# Patient Record
Sex: Male | Born: 1975 | Hispanic: Refuse to answer | Marital: Married | Smoking: Never smoker
Health system: Southern US, Community
[De-identification: ages and names within clinical notes are randomized; demographics above are authoritative.]

## PROBLEM LIST (undated history)

## (undated) DIAGNOSIS — M25511 Pain in right shoulder: Secondary | ICD-10-CM

## (undated) DIAGNOSIS — Z8619 Personal history of other infectious and parasitic diseases: Secondary | ICD-10-CM

## (undated) DIAGNOSIS — M25561 Pain in right knee: Secondary | ICD-10-CM

## (undated) DIAGNOSIS — Z808 Family history of malignant neoplasm of other organs or systems: Secondary | ICD-10-CM

## (undated) HISTORY — DX: Personal history of other infectious and parasitic diseases: Z86.19

## (undated) HISTORY — DX: Family history of malignant neoplasm of other organs or systems: Z80.8

## (undated) HISTORY — DX: Pain in right knee: M25.561

## (undated) HISTORY — DX: Pain in right shoulder: M25.511

## (undated) HISTORY — PX: KNEE SURGERY: SHX244

---

## 2019-05-31 ENCOUNTER — Ambulatory Visit: Payer: Self-pay | Admitting: Medical

## 2019-06-02 ENCOUNTER — Encounter: Payer: Self-pay | Admitting: Medical

## 2019-06-02 ENCOUNTER — Telehealth: Payer: Self-pay | Admitting: Medical

## 2019-06-02 ENCOUNTER — Ambulatory Visit: Payer: BC Managed Care – PPO | Admitting: Medical

## 2019-06-02 ENCOUNTER — Other Ambulatory Visit: Payer: Self-pay

## 2019-06-02 VITALS — BP 110/78 | HR 57 | Temp 97.8°F | Ht 73.0 in | Wt 214.0 lb

## 2019-06-02 DIAGNOSIS — Z7185 Encounter for immunization safety counseling: Secondary | ICD-10-CM | POA: Insufficient documentation

## 2019-06-02 DIAGNOSIS — R29898 Other symptoms and signs involving the musculoskeletal system: Secondary | ICD-10-CM | POA: Diagnosis not present

## 2019-06-02 DIAGNOSIS — Z9889 Other specified postprocedural states: Secondary | ICD-10-CM

## 2019-06-02 DIAGNOSIS — B001 Herpesviral vesicular dermatitis: Secondary | ICD-10-CM | POA: Insufficient documentation

## 2019-06-02 DIAGNOSIS — M25511 Pain in right shoulder: Secondary | ICD-10-CM

## 2019-06-02 DIAGNOSIS — Z8619 Personal history of other infectious and parasitic diseases: Secondary | ICD-10-CM

## 2019-06-02 DIAGNOSIS — Z808 Family history of malignant neoplasm of other organs or systems: Secondary | ICD-10-CM

## 2019-06-02 DIAGNOSIS — S60222A Contusion of left hand, initial encounter: Secondary | ICD-10-CM | POA: Diagnosis not present

## 2019-06-02 DIAGNOSIS — M542 Cervicalgia: Secondary | ICD-10-CM | POA: Diagnosis not present

## 2019-06-02 DIAGNOSIS — G8929 Other chronic pain: Secondary | ICD-10-CM

## 2019-06-02 DIAGNOSIS — Z7189 Other specified counseling: Secondary | ICD-10-CM

## 2019-06-02 DIAGNOSIS — S7002XA Contusion of left hip, initial encounter: Secondary | ICD-10-CM | POA: Insufficient documentation

## 2019-06-02 NOTE — Telephone Encounter (Signed)
Called [facility] and they states that for you to fax a RX for PT and fax it to them at [phone].   If you will sign the RX I will fax.
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
## 2019-06-02 NOTE — Telephone Encounter (Signed)
Refer to physical therapy.   He sees [facility] already.  Try referring to their in house physical therapist at [facility] for chronic neck and right shoulder issues.   If for whatever reason they wont let us refer, then refer to [facility] Physical therapy or [facility] Physical Therapy/sports medicine.

## 2019-06-03 NOTE — Telephone Encounter (Signed)
rx ready 

## 2019-06-03 NOTE — Telephone Encounter (Signed)
Faxed. done

## 2019-06-22 ENCOUNTER — Telehealth: Payer: Self-pay | Admitting: Family Medicine

## 2019-06-22 NOTE — Telephone Encounter (Signed)
Requested records from Dermatology Specialist received.

## 2019-06-30 ENCOUNTER — Encounter: Payer: Self-pay | Admitting: Medical

## 2019-07-11 ENCOUNTER — Encounter: Payer: Self-pay | Admitting: Medical

## 2019-08-19 ENCOUNTER — Other Ambulatory Visit: Payer: Self-pay

## 2019-08-19 ENCOUNTER — Encounter: Payer: Self-pay | Admitting: Medical

## 2019-08-19 ENCOUNTER — Ambulatory Visit: Payer: BC Managed Care – PPO | Admitting: Medical

## 2019-08-19 ENCOUNTER — Ambulatory Visit
Admission: RE | Admit: 2019-08-19 | Discharge: 2019-08-19 | Disposition: A | Discharge status: Home / Self Care | Payer: BC Managed Care – PPO | Source: Ambulatory Visit | Attending: Medical | Admitting: Medical

## 2019-08-19 VITALS — BP 118/79 | Temp 98.6°F | Ht 73.0 in | Wt 210.0 lb

## 2019-08-19 DIAGNOSIS — R0781 Pleurodynia: Secondary | ICD-10-CM

## 2019-08-19 DIAGNOSIS — S0081XA Abrasion of other part of head, initial encounter: Secondary | ICD-10-CM

## 2019-08-19 DIAGNOSIS — B001 Herpesviral vesicular dermatitis: Secondary | ICD-10-CM | POA: Diagnosis not present

## 2019-08-19 DIAGNOSIS — S40819A Abrasion of unspecified upper arm, initial encounter: Secondary | ICD-10-CM | POA: Diagnosis not present

## 2019-08-19 DIAGNOSIS — W19XXXA Unspecified fall, initial encounter: Secondary | ICD-10-CM

## 2019-08-19 DIAGNOSIS — R0789 Other chest pain: Secondary | ICD-10-CM

## 2019-08-19 MED ORDER — MUPIROCIN 2 % EX OINT: 1.0000 "application " | TOPICAL_OINTMENT | Freq: Three times a day (TID) | CUTANEOUS | 0 refills | Status: DC

## 2019-08-19 MED ORDER — VALACYCLOVIR HCL 1 G PO TABS: 1000.0000 mg | ORAL_TABLET | Freq: Every day | ORAL | 1 refills | Status: DC
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
## 2019-09-20 ENCOUNTER — Telehealth: Payer: Self-pay

## 2019-09-20 NOTE — Telephone Encounter (Signed)
Per pharmacy he filled 90 day supply on 10/21 and ins will not cover again until end of December   Pt states he discussed with you that he needed to take more frequently and wants to know if there is a way to get sooner

## 2019-09-20 NOTE — Telephone Encounter (Signed)
Pt. Called stating that he saw you about 1 month ago he said you guys discussed calling in Valtrex for cold sores he said they were never called in at the pharmacy.

## 2019-09-20 NOTE — Telephone Encounter (Signed)
Valtrex was indeed called in for 90# and refill at [facility] [address].   Please call and verify at pharmacy.  Done on 08/19/2019 I believe.

## 2019-09-21 NOTE — Telephone Encounter (Signed)
When we last talked, if I recall, we discussed that he uses this 1/2 tablet daily for prophylaxis, 2 tablets twice daily for 2 days for flare up, with an average of 1 flareup per month.   If this is the case, he would use on average 69 tablets per 3 months.  So #90 should be adequate.   If he is using 1 tablet daily for prophylaxis instead of 1/2 tablet daily, then it would not be adequate.     Thus, please verify because I thought he told me his prior PCP prescribed #90 at a time for 90 day supply.    Let me know and we can figure out what to do at the pharmacy.

## 2019-09-22 NOTE — Telephone Encounter (Signed)
Pt. Called back to let us know he may need a little more than #90 on the valtrex because last time he got #90 and it ran out before the 90 days was up, but he said if that's all you can send in that would be ok if we could maybe change it for next time.

## 2019-09-23 ENCOUNTER — Other Ambulatory Visit: Payer: Self-pay | Admitting: Medical

## 2019-09-23 MED ORDER — VALACYCLOVIR HCL 1 G PO TABS: 1000.0000 mg | ORAL_TABLET | Freq: Every day | ORAL | 1 refills | Status: DC

## 2019-09-23 NOTE — Telephone Encounter (Signed)
Okay, in that case call the pharmacy back and see if we change the quantity to #120 for a 90-day supply, can he go ahead and pick this up now since he is having to use some more frequently for flareup?  If they need me to just  send in a different prescription that is fine as well

## 2019-09-23 NOTE — Telephone Encounter (Signed)
Pharamcy already has a quantity of 120 waiting at the pharmacy for pick up.

## 2019-10-07 ENCOUNTER — Other Ambulatory Visit: Payer: Self-pay | Admitting: Medical

## 2019-11-11 ENCOUNTER — Other Ambulatory Visit: Payer: Self-pay | Admitting: Medical

## 2019-12-17 ENCOUNTER — Other Ambulatory Visit: Payer: Self-pay | Admitting: Medical

## 2019-12-20 NOTE — Telephone Encounter (Signed)
Please call pharmacy and find out why I keep getting this request for Valtrex even notes not due for renewal.  Every time they send these things when is not time, it confuses things, makes me and you do extra work trying to figure out when the last refill was, versus when due.

## 2020-05-22 ENCOUNTER — Other Ambulatory Visit: Payer: Self-pay

## 2020-05-22 ENCOUNTER — Other Ambulatory Visit: Payer: BC Managed Care – PPO

## 2020-05-22 DIAGNOSIS — Z20822 Contact with and (suspected) exposure to covid-19: Secondary | ICD-10-CM

## 2020-05-23 LAB — SARS-COV-2, NAA 2 DAY TAT

## 2020-05-23 LAB — NOVEL CORONAVIRUS, NAA: SARS-CoV-2, NAA: NOT DETECTED

## 2020-05-31 ENCOUNTER — Telehealth: Payer: Self-pay | Admitting: Medical

## 2020-05-31 ENCOUNTER — Other Ambulatory Visit: Payer: Self-pay

## 2020-05-31 ENCOUNTER — Telehealth: Payer: BC Managed Care – PPO | Admitting: Medical

## 2020-05-31 ENCOUNTER — Other Ambulatory Visit: Payer: Self-pay | Admitting: Medical

## 2020-05-31 VITALS — Ht 73.0 in | Wt 213.0 lb

## 2020-05-31 DIAGNOSIS — R0683 Snoring: Secondary | ICD-10-CM | POA: Insufficient documentation

## 2020-05-31 DIAGNOSIS — Z1329 Encounter for screening for other suspected endocrine disorder: Secondary | ICD-10-CM

## 2020-05-31 DIAGNOSIS — L237 Allergic contact dermatitis due to plants, except food: Secondary | ICD-10-CM | POA: Insufficient documentation

## 2020-05-31 DIAGNOSIS — B001 Herpesviral vesicular dermatitis: Secondary | ICD-10-CM | POA: Diagnosis not present

## 2020-05-31 DIAGNOSIS — R400 Somnolence: Secondary | ICD-10-CM

## 2020-05-31 DIAGNOSIS — F458 Other somatoform disorders: Secondary | ICD-10-CM | POA: Insufficient documentation

## 2020-05-31 DIAGNOSIS — Z0000 Encounter for general adult medical examination without abnormal findings: Secondary | ICD-10-CM | POA: Insufficient documentation

## 2020-05-31 DIAGNOSIS — N469 Male infertility, unspecified: Secondary | ICD-10-CM

## 2020-05-31 DIAGNOSIS — Z13220 Encounter for screening for lipoid disorders: Secondary | ICD-10-CM

## 2020-05-31 MED ORDER — METHYLPREDNISOLONE 4 MG PO TBPK
ORAL_TABLET | ORAL | 0 refills | Status: DC
Start: 2020-05-31 — End: 2021-07-18

## 2020-05-31 MED ORDER — VALACYCLOVIR HCL 1 G PO TABS: ORAL_TABLET | ORAL | 11 refills | Status: DC

## 2020-05-31 NOTE — Telephone Encounter (Signed)
error 

## 2020-05-31 NOTE — Telephone Encounter (Signed)
I put in orders including orders for fasting labs to do prior to his physical .  so he can come in for fasting prelabs, get with Byrd Hesselbach about the sperm collection.

## 2020-05-31 NOTE — Progress Notes (Signed)
Subjective:     Patient ID: Jesse Bentley, male   DOB: 1976/06/19, 44 y.o.   MRN: [id number]  This visit type was conducted due to national recommendations for restrictions regarding the COVID-19 Pandemic (e.g. social distancing) in an effort to limit this patient's exposure and mitigate transmission in our community.  Due to their co-morbid illnesses, this patient is at least at moderate risk for complications without adequate follow up.  This format is felt to be most appropriate for this patient at this time.    Documentation for virtual audio and video telecommunications through Zoom encounter:  The patient was located at home. The provider was located in the office. The patient did consent to this visit and is aware of possible charges through their insurance for this visit.  The other persons participating in this telemedicine service were none. Time spent on call was 20 minutes and in review of previous records 20 minutes total.  This virtual service is not related to other E/M service within previous 7 days.   HPI Chief Complaint  Patient presents with  . Medication Management    and discuss sleep study   . Referral    fertility clinic    Virtual consult today.  He has several concerns today.  He has recurrent cold sores.  Needs refill on the valacyclovir which works well for him.  He takes his daily for prevention.  He is prone to getting poison ivy dermatitis.  He recently had a flareup and was treated elsewhere.  He would like to have a round of medicine available as he has more weeding to do around the house.Marland Kitchen  He tries to take protection and precaution but still reacts commonly to poison ivy  Him and wife are trying to get pregnant.  He would like a referral to a fertility specialist.  He wants to check his ability to produce children.  He does note history of some abnormal hormonal labs in the past.  He also apparently a history of Cushing's concern in the past..  He has  problems in the past and he has adiposity under control despite the fact the cycles and exercise regularly.  He has not had any testing up to this point.  Wife is seeing a fertility specialist but they are having some difficulty between communication with gynecologist and fertility specialist to get test results that she had done.  He notes a concern for sleep apnea.  He saw his dentist recently.  They are talking about doing a special mouthguard because he grinds his teeth but they are also concerned about sleep apnea.  He snores.  He is sleepy during the daytime.  He does not feel rested when he awakes.  His neck is greater than 17 inches.    Past Medical History:  Diagnosis Date  . Family history of melanoma    both parents  . H/O cold sores   . History of shingles    before 2010, 2 episodes  . Right knee pain   . Right shoulder pain     Review of Systems As in subjective    Objective:   Physical Exam Due to coronavirus pandemic stay at home measures, patient visit was virtual and they were not examined in person.   Ht 6\' 1"  (1.854 m)   Wt 213 lb (96.6 kg)   BMI 28.10 kg/m  General: Well-developed well-nourished no acute distress, white male Urticarial appearing rash with linear and roundish raised patches of erythema along  antecubital regions bilaterally and forearms      Assessment:     Encounter Diagnoses  Name Primary?  . Snoring Yes  . Teeth grinding   . Poison ivy dermatitis   . Recurrent cold sores   . Daytime somnolence   . 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  His current poison ivy flareup is resolving but he wants this on him for the next one  Recurrent cold sores-continue Valtrex for preventative measure, discussed proper  use of medication  Male fertility concerns-his wife is continuing to work with her fertility specialist.  I advised that he return for a physical, labs including some endocrine hormone evaluation.  We will call him back to give him information on doing sperm counts  Bunyan was seen today for medication management and referral.  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 INCREASE TO 2 TABLETS TWICE A DAY FOR 2 DAYS FOR FLARE UP -     methylPREDNISolone (MEDROL DOSEPAK) 4 MG TBPK tablet; 6 tablets day 1, 5 tablets day 2, 4 tablets day 3, 3 tablets day 4, 2 tablets day 5, 1 tablet day 6  f/u pending call back

## 2020-05-31 NOTE — Telephone Encounter (Signed)
Patient has been advised. Referral has been sent to [facility]. Patient stated if there is no difference in the test he is fine to have order put in here.

## 2020-05-31 NOTE — Telephone Encounter (Signed)
Please call patient back about today's visit  1-refer for home sleep study due to snoring, nonrestful sleep, daytime somnolence, sleep disturbance, 17 inch neck 2-I recommend he return for full physical and labs including hormone and endocrine lab evaluation given his concerns 3-our lab person said that to do a sperm count, we would place the order, he will come in and get the collection container, but this gets turned in at a lab in [place] with specific instructions.  If he wants to go this route let me know and I will put in the order and he can coordinate with Byrd Hesselbach.  The other option would be referral to urology

## 2020-06-01 NOTE — Telephone Encounter (Signed)
Lmom for patient to call to be put on the lab visit to see Byrd Hesselbach.

## 2020-07-03 ENCOUNTER — Other Ambulatory Visit: Payer: BC Managed Care – PPO

## 2020-07-03 ENCOUNTER — Other Ambulatory Visit: Payer: Self-pay

## 2020-07-03 DIAGNOSIS — Z0000 Encounter for general adult medical examination without abnormal findings: Secondary | ICD-10-CM

## 2020-07-03 DIAGNOSIS — Z13220 Encounter for screening for lipoid disorders: Secondary | ICD-10-CM

## 2020-07-03 DIAGNOSIS — N469 Male infertility, unspecified: Secondary | ICD-10-CM

## 2020-07-03 DIAGNOSIS — Z1329 Encounter for screening for other suspected endocrine disorder: Secondary | ICD-10-CM

## 2020-07-04 LAB — COMPREHENSIVE METABOLIC PANEL
ALT: 12 IU/L (ref 0–44)
AST: 19 IU/L (ref 0–40)
Albumin/Globulin Ratio: 2.6 — ABNORMAL HIGH (ref 1.2–2.2)
Albumin: 4.6 g/dL (ref 4.0–5.0)
Alkaline Phosphatase: 60 IU/L (ref 44–121)
BUN/Creatinine Ratio: 14 (ref 9–20)
BUN: 14 mg/dL (ref 6–24)
Bilirubin Total: 0.9 mg/dL (ref 0.0–1.2)
CO2: 21 mmol/L (ref 20–29)
Calcium: 9.3 mg/dL (ref 8.7–10.2)
Chloride: 102 mmol/L (ref 96–106)
Creatinine, Ser: 0.99 mg/dL (ref 0.76–1.27)
GFR calc Af Amer: 108 mL/min/{1.73_m2} (ref >59)
GFR calc non Af Amer: 94 mL/min/{1.73_m2} (ref >59)
Globulin, Total: 1.8 g/dL (ref 1.5–4.5)
Glucose: 91 mg/dL (ref 65–99)
Potassium: 4.2 mmol/L (ref 3.5–5.2)
Sodium: 137 mmol/L (ref 134–144)
Total Protein: 6.4 g/dL (ref 6.0–8.5)

## 2020-07-04 LAB — CBC WITH DIFFERENTIAL/PLATELET
Basophils Absolute: 0 10*3/uL (ref 0.0–0.2)
Basos: 1 %
EOS (ABSOLUTE): 0.1 10*3/uL (ref 0.0–0.4)
Eos: 2 %
Hematocrit: 44.7 % (ref 37.5–51.0)
Hemoglobin: 16.3 g/dL (ref 13.0–17.7)
Immature Grans (Abs): 0 10*3/uL (ref 0.0–0.1)
Immature Granulocytes: 0 %
Lymphocytes Absolute: 1.2 10*3/uL (ref 0.7–3.1)
Lymphs: 35 %
MCH: 33.8 pg — ABNORMAL HIGH (ref 26.6–33.0)
MCHC: 36.5 g/dL — ABNORMAL HIGH (ref 31.5–35.7)
MCV: 93 fL (ref 79–97)
Monocytes Absolute: 0.4 10*3/uL (ref 0.1–0.9)
Monocytes: 12 %
Neutrophils Absolute: 1.8 10*3/uL (ref 1.4–7.0)
Neutrophils: 50 %
Platelets: 183 10*3/uL (ref 150–450)
RBC: 4.82 x10E6/uL (ref 4.14–5.80)
RDW: 12.8 % (ref 11.6–15.4)
WBC: 3.5 10*3/uL (ref 3.4–10.8)

## 2020-07-04 LAB — TSH: TSH: 1.32 u[IU]/mL (ref 0.450–4.500)

## 2020-07-04 LAB — TESTOSTERONE: Testosterone: 629 ng/dL (ref 264–916)

## 2020-07-04 LAB — LIPID PANEL
Chol/HDL Ratio: 3.1 ratio (ref 0.0–5.0)
Cholesterol, Total: 221 mg/dL — ABNORMAL HIGH (ref 100–199)
HDL: 72 mg/dL (ref >39)
LDL Chol Calc (NIH): 140 mg/dL — ABNORMAL HIGH (ref 0–99)
Triglycerides: 54 mg/dL (ref 0–149)
VLDL Cholesterol Cal: 9 mg/dL (ref 5–40)

## 2020-07-04 LAB — FSH/LH
FSH: 5.6 m[IU]/mL (ref 1.5–12.4)
LH: 6.1 m[IU]/mL (ref 1.7–8.6)

## 2020-07-04 LAB — PROLACTIN: Prolactin: 13.5 ng/mL (ref 4.0–15.2)

## 2020-07-17 ENCOUNTER — Other Ambulatory Visit: Payer: Self-pay

## 2020-07-17 ENCOUNTER — Encounter: Payer: Self-pay | Admitting: Medical

## 2020-07-17 ENCOUNTER — Ambulatory Visit (INDEPENDENT_AMBULATORY_CARE_PROVIDER_SITE_OTHER): Payer: BC Managed Care – PPO | Admitting: Medical

## 2020-07-17 ENCOUNTER — Telehealth: Payer: Self-pay | Admitting: Medical

## 2020-07-17 VITALS — BP 116/70 | HR 68 | Ht 73.0 in | Wt 218.4 lb

## 2020-07-17 DIAGNOSIS — R400 Somnolence: Secondary | ICD-10-CM

## 2020-07-17 DIAGNOSIS — M25511 Pain in right shoulder: Secondary | ICD-10-CM

## 2020-07-17 DIAGNOSIS — F458 Other somatoform disorders: Secondary | ICD-10-CM | POA: Diagnosis not present

## 2020-07-17 DIAGNOSIS — R0683 Snoring: Secondary | ICD-10-CM

## 2020-07-17 DIAGNOSIS — N469 Male infertility, unspecified: Secondary | ICD-10-CM

## 2020-07-17 DIAGNOSIS — Z13220 Encounter for screening for lipoid disorders: Secondary | ICD-10-CM

## 2020-07-17 DIAGNOSIS — G4733 Obstructive sleep apnea (adult) (pediatric): Secondary | ICD-10-CM

## 2020-07-17 DIAGNOSIS — Z1329 Encounter for screening for other suspected endocrine disorder: Secondary | ICD-10-CM

## 2020-07-17 DIAGNOSIS — G8929 Other chronic pain: Secondary | ICD-10-CM

## 2020-07-17 NOTE — Telephone Encounter (Signed)
Please call [facility] and see if they have someone that deals with abnormalities of sperm or fertility.  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  We may refer

## 2020-07-17 NOTE — Progress Notes (Addendum)
Established patient visit   Patient: Jesse Bentley   DOB: [birth year]   44 y.o. Male  MRN: [id number] Visit Date: 07/17/2020  Today's healthcare provider: Kristian Covey, PA-C   Chief Complaint  Patient presents with  . Follow-up    recent sleep study and labs-fertility testing    Subjective    HPI HPI    Follow-up     Additional comments: recent sleep study and labs-fertility testing        Last edited by Victorio Palm, CMA on 07/17/2020  9:00 AM. (History)      Follow-up Patient presents today for sleep study follow-up. Patient sleep study report showed moderate sleep apnea. He scoring was 15 for AHI. His oxygen level when sleeping was 83% but average oxygen was in normal range. Patient reports that he only did 2 days at the home sleep and not the full 3 days. He states that he would like to try the mouth guard instead of the CPAP machine.   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 He reports that the doctor recommended him to try Vitamin D, Omega 3, and folic acid. Patient reports he is currently taking Vitamin D, Omega 3 and his wife prenatal vitamins.           Medications: Outpatient Medications Prior to Visit  Medication Sig  . valACYclovir (VALTREX) 1000 MG tablet TAKE 1 TABLET BY MOUTH DAILY. INCREASE TO 2 TABLETS TWICE A DAY FOR 2 DAYS FOR FLARE UP  . acetaminophen (TYLENOL) 500 MG tablet Take 500 mg by mouth every 6 (six) hours as needed. (Patient not taking: Reported on 05/31/2020)  . CREATINE PO Take by mouth. (Patient not taking: Reported on 05/31/2020)    . methylPREDNISolone (MEDROL DOSEPAK) 4 MG TBPK tablet 6 tablets day 1, 5 tablets day 2, 4 tablets day 3, 3 tablets day 4, 2 tablets day 5, 1 tablet day 6 (Patient not taking: Reported on 07/17/2020)  . methylPREDNISolone (MEDROL) 4 MG tablet Take 4 mg by mouth daily. (Patient not taking: Reported on 07/17/2020)  . mupirocin ointment (BACTROBAN) 2 % Apply 1 application topically 3 (three) times daily. (Patient not taking: Reported on 07/17/2020)  . Probiotic Product (PROBIOTIC-10 PO) Take by mouth. (Patient not taking: Reported on 05/31/2020)   No facility-administered medications prior to visit.    Review of Systems    Objective    BP 116/70   Pulse 68   Ht 6\' 1"  (1.854 m)   Wt 218 lb 6.4 oz (99.1 kg)   SpO2 95%   BMI 28.81 kg/m   Physical Exam Constitutional:      Appearance: Normal appearance.  Cardiovascular:     Rate and Rhythm: Normal rate and regular rhythm.     Pulses: Normal pulses.     Heart sounds: Normal heart sounds.  Pulmonary:     Effort: Pulmonary effort is normal.     Breath sounds: Normal breath sounds.  Skin:    General: Skin is warm and dry.  Neurological:  Mental Status: He is alert.         Assessment & Plan     Encounter Diagnoses  Name Primary?  . Teeth grinding Yes  . 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 Chronic right shoulder pain   . Snoring   . Daytime somnolence   . Screening for lipid disorders   . OSA (obstructive sleep apnea)   . Screening for endocrine disorder     OSA, snoring, daytime somnolence-we reviewed his recent sleep study showing an AHI of 15, no significant hypoxemia.  We discussed possible treatments.  He is an avid cyclist and exercise regularly.  He does not want to pursue CPAP at this time.  He is going to work with his dentist to get an oral mouthpiece.  I gave him a copy of his sleep study.  We can follow-up in a month or 2 after he has been using his mouthpiece  No fertility problems-we reviewed his recent labs which  were mostly normal except for total cholesterol a little elevated.  He did have a sperm analysis that he emailed me the results of.  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  His wife's fertility specialist advised to use a collection of vitamins that he is for the most part doing except not in the specific doses.  He is going to work on modifying his dosing.  I will contact urology here locally to see if it would benefit him to see them in consult  Chronic right shoulder pain-he continues with chiropractic therapy with Dr. Van Clines.  He already did physical therapy but did not feel like he was making progress.  We reviewed his recent lipids.  Counseled on diet, continue regular exercise   Addendum: added diagnoses codes for labs done ahead of this appt as we discussed on phone.  Kristian Covey, PA-C  [facility] [phone] (phone) [phone] (fax)  [facility]

## 2020-07-19 NOTE — Telephone Encounter (Signed)
All pf the providers at [facility] will see patients for fertility except 1. Are we referring

## 2020-07-19 NOTE — Telephone Encounter (Signed)
Yes please refer to [facility]

## 2020-07-20 NOTE — Telephone Encounter (Signed)
Referral sent 

## 2020-07-24 ENCOUNTER — Encounter: Payer: Self-pay | Admitting: Medical

## 2020-08-08 ENCOUNTER — Encounter: Payer: Self-pay | Admitting: Medical

## 2020-11-01 ENCOUNTER — Other Ambulatory Visit: Payer: Self-pay | Admitting: Chiropractic Medicine

## 2020-11-01 DIAGNOSIS — M25511 Pain in right shoulder: Secondary | ICD-10-CM

## 2020-11-12 ENCOUNTER — Ambulatory Visit (INDEPENDENT_AMBULATORY_CARE_PROVIDER_SITE_OTHER): Payer: BC Managed Care – PPO

## 2020-11-12 ENCOUNTER — Other Ambulatory Visit: Payer: Self-pay

## 2020-11-12 DIAGNOSIS — M25511 Pain in right shoulder: Secondary | ICD-10-CM

## 2020-11-12 MED ORDER — GADOBUTROL 1 MMOL/ML IV SOLN
10.0000 mL | Freq: Once | INTRAVENOUS | Status: AC | PRN
  Administered 2020-11-12: 10 mL via INTRAVENOUS

## 2020-11-28 ENCOUNTER — Ambulatory Visit: Payer: BC Managed Care – PPO | Attending: Family

## 2020-11-28 DIAGNOSIS — Z23 Encounter for immunization: Secondary | ICD-10-CM

## 2021-03-22 NOTE — Progress Notes (Signed)
   Covid-19 Vaccination Clinic  Name:  Hakim Minniefield    MRN: [id number] DOB: 11-29-1975  03/22/2021  Mr. Dante was observed post Covid-19 immunization for 15 minutes without incident. He was provided with Vaccine Information Sheet and instruction to access the V-Safe system.   Mr. Bouillon was instructed to call 911 with any severe reactions post vaccine: Difficulty breathing  Swelling of face and throat  A fast heartbeat  A bad rash all over body  Dizziness and weakness   Immunizations Administered     Name Date Dose VIS Date Route   Pfizer COVID-19 Vaccine 11/28/2020  8:30 AM 0.3 mL 08/01/2020 Intramuscular   Manufacturer: [employer], [employer]   Lot: [id number]   NDC: [address]-7

## 2021-07-06 ENCOUNTER — Other Ambulatory Visit: Payer: Self-pay | Admitting: Medical

## 2021-07-08 MED ORDER — VALACYCLOVIR HCL 1 G PO TABS: ORAL_TABLET | ORAL | 0 refills | Status: DC

## 2021-07-08 NOTE — Telephone Encounter (Signed)
done

## 2021-07-08 NOTE — Telephone Encounter (Signed)
Pt called and made an appointment for a medcheck next week. Will need a refill on medication due to a flare up. Please send to [facility] [address]

## 2021-07-18 ENCOUNTER — Other Ambulatory Visit: Payer: Self-pay

## 2021-07-18 ENCOUNTER — Telehealth: Payer: Self-pay | Admitting: Medical

## 2021-07-18 ENCOUNTER — Encounter: Payer: Self-pay | Admitting: Medical

## 2021-07-18 ENCOUNTER — Ambulatory Visit (INDEPENDENT_AMBULATORY_CARE_PROVIDER_SITE_OTHER): Payer: BC Managed Care – PPO | Admitting: Medical

## 2021-07-18 VITALS — BP 120/82 | HR 76 | Wt 221.0 lb

## 2021-07-18 DIAGNOSIS — G4733 Obstructive sleep apnea (adult) (pediatric): Secondary | ICD-10-CM

## 2021-07-18 DIAGNOSIS — G8929 Other chronic pain: Secondary | ICD-10-CM

## 2021-07-18 DIAGNOSIS — M25511 Pain in right shoulder: Secondary | ICD-10-CM

## 2021-07-18 DIAGNOSIS — B001 Herpesviral vesicular dermatitis: Secondary | ICD-10-CM

## 2021-07-18 DIAGNOSIS — Z808 Family history of malignant neoplasm of other organs or systems: Secondary | ICD-10-CM

## 2021-07-18 DIAGNOSIS — G479 Sleep disorder, unspecified: Secondary | ICD-10-CM

## 2021-07-18 DIAGNOSIS — Z7185 Encounter for immunization safety counseling: Secondary | ICD-10-CM | POA: Diagnosis not present

## 2021-07-18 DIAGNOSIS — N469 Male infertility, unspecified: Secondary | ICD-10-CM

## 2021-07-18 DIAGNOSIS — L989 Disorder of the skin and subcutaneous tissue, unspecified: Secondary | ICD-10-CM

## 2021-07-18 DIAGNOSIS — F458 Other somatoform disorders: Secondary | ICD-10-CM

## 2021-07-18 MED ORDER — VALACYCLOVIR HCL 1 G PO TABS: 1000.0000 mg | ORAL_TABLET | Freq: Every day | ORAL | 3 refills | Status: DC

## 2021-07-18 NOTE — Telephone Encounter (Signed)
Please send a message to 2 different specialist.  I am trying to find out if either one of the specialist deals with fertility issues with me in.  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  His wife reportedly had a normal evaluation.  They still were unable to have babies and the concern was the morphology of his sperm.  Please call [facility] and see if they deal with this issue.  Please also call Dr. Jannifer Franklin to see if he deals with this issue  Dr. April Manson, fertility [place] [phone]  [facility]
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
## 2021-07-19 NOTE — Telephone Encounter (Signed)
Pt was notified and I have refer pt over to [facility]

## 2021-07-19 NOTE — Telephone Encounter (Signed)
[facility] has a reproductive urology in the office that handles males. Would you like me to refer

## 2021-07-31 ENCOUNTER — Other Ambulatory Visit: Payer: Self-pay | Admitting: Medical

## 2021-08-29 ENCOUNTER — Telehealth: Payer: Self-pay | Admitting: Internal Medicine

## 2021-08-29 ENCOUNTER — Other Ambulatory Visit: Payer: Self-pay | Admitting: Medical

## 2021-08-29 DIAGNOSIS — N469 Male infertility, unspecified: Secondary | ICD-10-CM

## 2021-08-29 NOTE — Telephone Encounter (Signed)
Pt states he would like a referral to [facility] for infertility. Pt did not like [place] infertility specialist and wants to be referred else where. Ok to refer

## 2021-08-30 NOTE — Telephone Encounter (Signed)
Left message for pt to call me back 

## 2021-08-30 NOTE — Telephone Encounter (Signed)
Pt was notified.  

## 2022-06-18 ENCOUNTER — Encounter: Payer: Self-pay | Admitting: Internal Medicine

## 2022-07-22 ENCOUNTER — Encounter: Payer: Self-pay | Admitting: Internal Medicine

## 2022-10-07 ENCOUNTER — Other Ambulatory Visit: Payer: Self-pay | Admitting: Medical

## 2022-10-28 ENCOUNTER — Encounter: Payer: Self-pay | Admitting: Medical

## 2022-10-28 ENCOUNTER — Ambulatory Visit (INDEPENDENT_AMBULATORY_CARE_PROVIDER_SITE_OTHER): Payer: BC Managed Care – PPO | Admitting: Medical

## 2022-10-28 ENCOUNTER — Telehealth: Payer: BC Managed Care – PPO | Admitting: Medical

## 2022-10-28 VITALS — BP 128/74 | HR 60 | Temp 98.5°F | Wt 231.0 lb

## 2022-10-28 DIAGNOSIS — B001 Herpesviral vesicular dermatitis: Secondary | ICD-10-CM

## 2022-10-28 DIAGNOSIS — R0683 Snoring: Secondary | ICD-10-CM | POA: Diagnosis not present

## 2022-10-28 DIAGNOSIS — G4733 Obstructive sleep apnea (adult) (pediatric): Secondary | ICD-10-CM | POA: Diagnosis not present

## 2022-10-28 DIAGNOSIS — G479 Sleep disorder, unspecified: Secondary | ICD-10-CM | POA: Diagnosis not present

## 2022-10-28 MED ORDER — VALACYCLOVIR HCL 1 G PO TABS: ORAL_TABLET | ORAL | 1 refills | Status: DC

## 2022-10-28 NOTE — Progress Notes (Signed)
Subjective:  Jesse Bentley is a 47 y.o. male who presents for Chief Complaint  Patient presents with   other    F/u doing ok, had covid after the first worked through that, flare up with lips and discuss sleep apnea,      Here for a few concerns.   He reports a flare up of his lips.  Typically keeps a 6 month supply for valacyclovir.  Has current outbreak  Gets chapped lips as well.  Uses bees wax lip balm as well as valtrex for flare ups.   During periods of high stress will also use valtrex daily for prophylaxis.  In the past he would get #180 valtrex at a time, 58mo supply.  Has questions about sleep.   He did sleep study 2021, had moderate sleep apnea.  It was recommended to use CPAP at that time.   He didn't want to use CPAP.  He ended up getting oral airway device by oral surgeon and it broke within 2 weeks.  He has had follow up with oral surgeon and got refund .  Since then wife has continue to have concerns about his breathing and sleep.   He thinks he may be willing to pursue CPAP now.  No other aggravating or relieving factors.    No other c/o.  Past Medical History:  Diagnosis Date   Family history of melanoma    both parents   H/O cold sores    History of shingles    before 2010, 2 episodes   Right knee pain    Right shoulder pain    Current Outpatient Medications on File Prior to Visit  Medication Sig Dispense Refill   CREATINE PO Take by mouth.     valACYclovir (VALTREX) 1000 MG tablet TAKE 1 TABLET (1,000 MG TOTAL) BY MOUTH DAILY. TAKE 1 TABLET BY MOUTH DAILY. INCREASE TO 2 TABLETS TWICE A DAY FOR 2 DAYS FOR FLARE UP 30 tablet 0   acetaminophen (TYLENOL) 500 MG tablet Take 500 mg by mouth every 6 (six) hours as needed. (Patient not taking: Reported on 05/31/2020)     No current facility-administered medications on file prior to visit.   The following portions of the patient's history were reviewed and updated as appropriate: allergies, current medications, past family  history, past medical history, past social history, past surgical history and problem list.   ROS Otherwise as in subjective above    Objective: BP 128/74   Pulse 60   Temp 98.5 F (36.9 C)   Wt 231 lb (104.8 kg)   BMI 30.48 kg/m   General appearance: alert, no distress, well developed, well nourished HEENT: normocephalic, sclerae anicteric, conjunctiva pink and moist, nares patent, no discharge or erythema, pharynx normal Oral cavity: Chapped lips and some crusting around the perimeter of the lips, MMM, no intraoral lesions Neck: supple, no lymphadenopathy, no thyromegaly, no masses Heart: RRR, normal S1, S2, no murmurs Lungs: CTA bilaterally, no wheezes, rhonchi, or rales Pulses: 2+ radial pulses, 2+ pedal pulses, normal cap refill     Assessment: Encounter Diagnoses  Name Primary?   Recurrent cold sores Yes   OSA (obstructive sleep apnea)    Snoring    Sleep disturbance      Plan: Recurrent cold sores -we discussed use of Valtrex for prophylaxis versus acute therapy.  He uses Valtrex some of the year prophylactic under periods of high stress.  Prefers 180 tablets at a time for refills  Check lipids-continue using lip balm  and good hydration  Snoring, sleep disturbance-referral to CPAP.  He failed trial of oral airway device and I reviewed his abnormal sleep study from 2021  showing moderate sleep apnea.   Jesse Bentley was seen today for other.  Diagnoses and all orders for this visit:  Recurrent cold sores  OSA (obstructive sleep apnea)  Snoring  Sleep disturbance   Follow up: after CPAP trial

## 2022-10-29 ENCOUNTER — Other Ambulatory Visit: Payer: Self-pay

## 2022-10-29 DIAGNOSIS — G4733 Obstructive sleep apnea (adult) (pediatric): Secondary | ICD-10-CM

## 2022-10-29 DIAGNOSIS — R0683 Snoring: Secondary | ICD-10-CM

## 2023-01-14 ENCOUNTER — Telehealth: Payer: Self-pay | Admitting: Medical

## 2023-01-14 NOTE — Telephone Encounter (Signed)
Sleep study done in 2021. Pt needed cpap.  Left message for pt to call back to schedule a 30 day visit after starting on CPAP machine again

## 2023-01-14 NOTE — Telephone Encounter (Signed)
I have a note from [employer] about doing a follow-up appointment however I do not see sleep study results yet.  Please inquire about sleep study results

## 2023-01-22 ENCOUNTER — Ambulatory Visit: Payer: BC Managed Care – PPO | Admitting: Medical

## 2023-01-22 VITALS — BP 120/68 | HR 72 | Wt 231.0 lb

## 2023-01-22 DIAGNOSIS — M79672 Pain in left foot: Secondary | ICD-10-CM | POA: Diagnosis not present

## 2023-01-22 DIAGNOSIS — G4733 Obstructive sleep apnea (adult) (pediatric): Secondary | ICD-10-CM | POA: Diagnosis not present

## 2023-01-22 DIAGNOSIS — Z789 Other specified health status: Secondary | ICD-10-CM | POA: Diagnosis not present

## 2023-01-22 DIAGNOSIS — M79671 Pain in right foot: Secondary | ICD-10-CM

## 2023-01-22 NOTE — Patient Instructions (Signed)
Sleep apnea You are having difficulty with the mask not allowing you to have full inspiration expiration.  I called and spoke to [facility], representative with [employer].  I am sorry you and them have had difficulty communicating with each other.  They apparently tried to call on March 4, March 8 and March 27 for their records.    Please call [employer] back at the following number [phone].  I would reach out to your respiratory therapist that you talk to before, I believe [place].  They are aware of the issue and want to help you with a mask exchange and trying to figure out a different plan.  They can even do a in-home consult as well.  You may want to ask for that  If you continue to have problems getting in touch with them the let us know and we will give you the number directly for [facility].

## 2023-01-22 NOTE — Progress Notes (Addendum)
Subjective:  Jesse Bentley is a 47 y.o. male who presents for Chief Complaint  Patient presents with   compliance CPAP    Compliance CPAP - only been using it for a month     Here for follow up on CPAP.   Last visit in January he was agreeable to trial of CPAP.   Since last visit in January, tried CPAP through [facility].  Tried one mask, but was having trouble with the mask, was having trouble inhaling and exhaling.  He felt like he couldn't breath all the way in or all the way out.  He contacted them, tried a different mask, but was having the same issues.  He contacted them again, was assigned to a respiratory therapist, but now all he is getting is programmed emails, no live person at [facility].  Additionally got a bill from [employer] that he is not compliant.   He did sleep study 2021, had moderate sleep apnea.  It was recommended to use CPAP at that time.   He didn't want to use CPAP then.  He ended up getting oral airway device by oral surgeon and it broke within 2 weeks.  He has had follow up with oral surgeon and got refund.  Since he continued to have breathing issues and sleep issues, he came back in 10/2022 to discuss using CPAP.   He has stressful moments as it closer to end of semester.  He tends to drink a little more beers during this time. He knows the later he drinks in the evening, this will interfere with sleep.   As the semester ends, he feels a relief.   During summer he picks back up exercise more.  Currently doing 3 days per week exercise.  He is a professor of exercise physiology.    He has some issues with feet issues.  Wants some help with insoles or possibly see podiatry.  No other aggravating or relieving factors.    No other c/o.  Past Medical History:  Diagnosis Date   Family history of melanoma    both parents   H/O cold sores    History of shingles    before 2010, 2 episodes   Right knee pain    Right shoulder pain    Current Outpatient Medications on File Prior  to Visit  Medication Sig Dispense Refill   acetaminophen (TYLENOL) 500 MG tablet Take 500 mg by mouth every 6 (six) hours as needed.     CREATINE PO Take by mouth.     valACYclovir (VALTREX) 1000 MG tablet Daily for prophylaxis  or 2 tablets BID for 2 days for flare up 180 tablet 1   No current facility-administered medications on file prior to visit.   The following portions of the patient's history were reviewed and updated as appropriate: allergies, current medications, past family history, past medical history, past social history, past surgical history and problem list.   ROS Otherwise as in subjective above    Objective: BP 120/68   Pulse 72   Wt 231 lb (104.8 kg)   BMI 30.48 kg/m   Wt Readings from Last 3 Encounters:  01/22/23 231 lb (104.8 kg)  10/28/22 231 lb (104.8 kg)  07/18/21 221 lb (100.2 kg)   General appearance: alert, no distress, well developed, well nourished     Assessment: Encounter Diagnoses  Name Primary?   OSA (obstructive sleep apnea) Yes   Difficulty using continuous positive airway pressure (CPAP) device    Pain in  both feet       Plan: OSA we discussed the problems he was having.  I called and spoke to [facility] with [employer] to assist in finding a solution.  He has central sleep apnea, and failed oral airway device.  We did discuss trying to lose weight, limit or avoid sedating medications or alcohol late evening as this affects sleep apnea.  Feet pain - briefly discussed as he asked about this late in the visit.  He will consider podiatry referral.  He will call back and let me know.    Patient Instructions  Sleep apnea You are having difficulty with the mask not allowing you to have full inspiration expiration.  I called and spoke to [facility], representative with [employer].  I am sorry you and them have had difficulty communicating with each other.  They apparently tried to call on March 4, March 8 and March 27 for their records.     Please call [employer] back at the following number [phone].  I would reach out to your respiratory therapist that you talk to before, I believe Jordan.  They are aware of the issue and want to help you with a mask exchange and trying to figure out a different plan.  They can even do a in-home consult as well.  You may want to ask for that  If you continue to have problems getting in touch with them the let us know and we will give you the number directly for [facility].     Bertram was seen today for compliance cpap.  Diagnoses and all orders for this visit:  OSA (obstructive sleep apnea)  Difficulty using continuous positive airway pressure (CPAP) device  Pain in both feet    Follow up: yearly for well visit.

## 2023-01-29 ENCOUNTER — Encounter: Payer: Self-pay | Admitting: Medical

## 2023-04-24 ENCOUNTER — Other Ambulatory Visit: Payer: Self-pay | Admitting: Medical

## 2023-05-29 ENCOUNTER — Telehealth: Payer: BC Managed Care – PPO | Admitting: Medical

## 2023-05-29 ENCOUNTER — Telehealth: Payer: Self-pay | Admitting: Medical

## 2023-05-29 DIAGNOSIS — G4733 Obstructive sleep apnea (adult) (pediatric): Secondary | ICD-10-CM | POA: Diagnosis not present

## 2023-05-29 NOTE — Telephone Encounter (Signed)
[facility] is closed on Fridays. I have sent a email to [facility] with [employer] about patient concerns

## 2023-05-29 NOTE — Telephone Encounter (Signed)
Per Matt with [employer],   He just didn't meet the usage requirements in the first 90 days. Patient was aware of this at the time of setup and received multiple notifications about this usage requirements throughout the first 90 days of usage. This is just an insurance requirement and doesn't have anything to do with an [employer] decision.   One of our compliance team members gave him some options back on Feb 17 2023 what the next steps would be. Looks like we did do a mask swap early on but honestly according to the download he really never tried to use it that much.

## 2023-05-29 NOTE — Telephone Encounter (Signed)
Please call [facility].  I am curious about referring this patient to them.  Let them know the situation first, maybe speak to one of their nurses.  I would like to refer him to Dr. Ermalinda Memos there whom I know personally  The issue is that he has a diagnosis of sleep apnea, AHI 15 that was done a couple years ago.  For months now we have went back and forth with [facility].   He feels like they have not done a great job of communicating with him or giving him advice.  He initially had benefit with the CPAP but then shortly thereafter did not seem to be working.  After having trouble communicating due to phone tag and just communication problems in general with [facility], he seemed like they were more interested in payment for the device and actually finding a solution as the machine was not seeming to help  I think he needs more hands-on assistance with finding a solution with a CPAP device.  Will they see him as a patient given that we have already initiated this or does he need to see pulmonology instead?    Also Martie Lee, please call Matt with [employer].  The patient felt like when he spoke to them recently since the April visit here that although I had called and spoke to [facility] personally back in April we were all under the impression that they were going to adjust his settings or help him with the mask to find a solution.  Instead he feels like they were not really trying to help him and just wanted to ask him about payment.  He did not fill he should be responsible for payment for a device that was not working for months particular with poor customer service in trying to find solutions.

## 2023-05-29 NOTE — Progress Notes (Signed)
Subjective:     Patient ID: Jesse Bentley, male   DOB: 09-10-1976, 47 y.o.   MRN: [id number]  This visit type was conducted due to national recommendations for restrictions regarding the COVID-19 Pandemic (e.g. social distancing) in an effort to limit this patient's exposure and mitigate transmission in our community.  Due to their co-morbid illnesses, this patient is at least at moderate risk for complications without adequate follow up.  This format is felt to be most appropriate for this patient at this time.    Documentation for virtual audio and video telecommunications through [place] encounter:  The patient was located at home. The provider was located in the office. The patient did consent to this visit and is aware of possible charges through their insurance for this visit.  The other persons participating in this telemedicine service were none. Time spent on call was 20 minutes and in review of previous records 20 minutes total.  This virtual service is not related to other E/M service within previous 7 days.   HPI Chief Complaint  Patient presents with   Cpap questions    Cpap- no currently using cpap and no data on cpap for the last 90 days     Virtual consult regarding OSA.    At his last visit in April he was having trouble with the mask with CPAP.  When he for started CPAP months ago initially it worked well but then quickly thereafter he started having problems with not feeling like it was helping his symptoms and not feeling like the mask and device is working properly.    He then had trouble getting in touch with [facility] and between him and [employer], they were going back and forth playing phone tag.  So there was trouble with communication  At his April visit our office reached out to [facility] [facility] and we spoke with [facility] and discussed the issue.  They asked him to call back again which he did.  However when [employer], he felt like they were more concerned  with payment despite the fact that he was having trouble not able to use device as it did not seem to be working.  He notes today mention that the pressure settings may need to be adjusted but that would require call back to [place].  He feels like he was just getting the run around.  He then decided to turn the machine back in his he was not getting help and they were just has to lean him about money and payment.  Had tried oral airway device and it broke within 2 weeks, so doesn't want to try that again.   He would like to go a different direction to see if he can get better support.  He mention a new sleep center at [facility] as an option.   Past Medical History:  Diagnosis Date   Family history of melanoma    both parents   H/O cold sores    History of shingles    before 2010, 2 episodes   Right knee pain    Right shoulder pain    Current Outpatient Medications on File Prior to Visit  Medication Sig Dispense Refill   acetaminophen (TYLENOL) 500 MG tablet Take 500 mg by mouth every 6 (six) hours as needed.     CREATINE PO Take by mouth.     valACYclovir (VALTREX) 1000 MG tablet DAILY FOR PROPHYLAXIS OR 2 TABLETS TWICE A DAY FOR 2 DAYS FOR FLARE  UP 180 tablet 1   No current facility-administered medications on file prior to visit.     Review of Systems As in subjective    Objective:   Physical Exam Due to coronavirus pandemic stay at home measures, patient visit was virtual and they were not examined in person.   There were no vitals taken for this visit.  Wt Readings from Last 3 Encounters:  01/22/23 231 lb (104.8 kg)  10/28/22 231 lb (104.8 kg)  07/18/21 221 lb (100.2 kg)   General: Well-developed well-nourished no acute distress      Assessment:     Encounter Diagnosis  Name Primary?   OSA (obstructive sleep apnea) Yes       Plan:     We discussed his concerns and frustration.  After his April visit we really assumption that [facility] with [employer] was working  with him to find solutions.  It sounds like there was just more frustration after their conversation with each other.  At this point I will reach out to sleep specialist for possible referral to get more hands-on assistance with him getting CPAP therapy to work for him.  Of note, 2021 sleep study with AHI of 15.  He ended up breaking oral airway device for sleep apnea the week he got it.  That did not seem to be a good solution for him.  [name] was seen today for cpap questions.  Diagnoses and all orders for this visit:  OSA (obstructive sleep apnea)    F/u pending callback

## 2023-06-04 NOTE — Telephone Encounter (Signed)
Left message for [facility] to call me back

## 2023-06-05 NOTE — Telephone Encounter (Signed)
I left message for [facility] yesterday and they are not open today.

## 2023-06-05 NOTE — Telephone Encounter (Signed)
Spoke with Jesse Bentley at [facility] and I have faxed everything over to here and they will take a look at his records and set him up with a physican there to help get a better fit of his CPAP

## 2023-06-16 ENCOUNTER — Telehealth: Payer: Self-pay | Admitting: Family Medicine

## 2023-06-16 NOTE — Telephone Encounter (Signed)
Pt called and left message that [employer] has now sent him to collections.  Matt with [employer] was here in the office and he discussed this with me.  When patient was set up, he had a virtual visit.  He was told that compliance requirements from the insurance. Ins required 21 out of 30 days that he use the machine 4 or more hours per night.  Patient did not comply.  Pt had the opportunity to swap mask within 15 days, which he did.    Compliance called pt Mar4 to encourage usage left message Compliance called Mar 18th and Mar 27 to encourage usage.  They gave him the option to turn the machine in or complete another sleep study.  Patient has turned machine in.  July 12th [employer] sent pt to collections for $651.26.  Susy Frizzle will talk with billing and compliance and call patient and call me back with update.  Called pt advised Susy Frizzle will be calling him and Aleck will call me back if not heard from him within 1 - 2 days.

## 2023-06-17 NOTE — Telephone Encounter (Signed)
Jesse Bentley with [employer] called back and left message that Sleep Therapy Coach Jesse Bentley will call the patient.  There were emails that went out to patient regarding compliance.  Patient knew about the compliance.  He received bills in March.  [place] will reach out.

## 2023-06-24 ENCOUNTER — Telehealth: Payer: Self-pay | Admitting: Family Medicine

## 2023-06-24 NOTE — Telephone Encounter (Signed)
Recd notice from [facility] that they cannot reach patient. I sent EPIC message to pt and also emailed same.

## 2024-10-07 ENCOUNTER — Encounter (HOSPITAL_COMMUNITY): Payer: Self-pay

## 2024-10-07 ENCOUNTER — Emergency Department (HOSPITAL_COMMUNITY)

## 2024-10-07 ENCOUNTER — Other Ambulatory Visit: Payer: Self-pay

## 2024-10-07 ENCOUNTER — Emergency Department (HOSPITAL_COMMUNITY)
Admission: EM | Admit: 2024-10-07 | Discharge: 2024-10-07 | Disposition: A | Discharge status: Home / Self Care | Attending: Emergency Medicine | Admitting: Emergency Medicine

## 2024-10-07 DIAGNOSIS — R259 Unspecified abnormal involuntary movements: Secondary | ICD-10-CM | POA: Insufficient documentation

## 2024-10-07 LAB — URINE DRUG SCREEN
Amphetamines: NEGATIVE
Barbiturates: NEGATIVE
Benzodiazepines: NEGATIVE
Cocaine: NEGATIVE
Fentanyl: NEGATIVE
Methadone Scn, Ur: NEGATIVE
Opiates: NEGATIVE
Tetrahydrocannabinol: POSITIVE — AB

## 2024-10-07 LAB — CBC WITH DIFFERENTIAL/PLATELET
Abs Immature Granulocytes: 0.01 K/uL (ref 0.00–0.07)
Basophils Absolute: 0 K/uL (ref 0.0–0.1)
Basophils Relative: 0 %
Eosinophils Absolute: 0.1 K/uL (ref 0.0–0.5)
Eosinophils Relative: 2 %
HCT: 40.6 % (ref 39.0–52.0)
Hemoglobin: 14.3 g/dL (ref 13.0–17.0)
Immature Granulocytes: 0 %
Lymphocytes Relative: 31 %
Lymphs Abs: 1.5 K/uL (ref 0.7–4.0)
MCH: 32.4 pg (ref 26.0–34.0)
MCHC: 35.2 g/dL (ref 30.0–36.0)
MCV: 92.1 fL (ref 80.0–100.0)
Monocytes Absolute: 0.7 K/uL (ref 0.1–1.0)
Monocytes Relative: 15 %
Neutro Abs: 2.4 K/uL (ref 1.7–7.7)
Neutrophils Relative %: 52 %
Platelets: 168 K/uL (ref 150–400)
RBC: 4.41 MIL/uL (ref 4.22–5.81)
RDW: 13.3 % (ref 11.5–15.5)
WBC: 4.8 K/uL (ref 4.0–10.5)
nRBC: 0 % (ref 0.0–0.2)

## 2024-10-07 LAB — COMPREHENSIVE METABOLIC PANEL WITH GFR
ALT: 17 U/L (ref 0–44)
AST: 28 U/L (ref 15–41)
Albumin: 4 g/dL (ref 3.5–5.0)
Alkaline Phosphatase: 55 U/L (ref 38–126)
Anion gap: 10 (ref 5–15)
BUN: 22 mg/dL — ABNORMAL HIGH (ref 6–20)
CO2: 24 mmol/L (ref 22–32)
Calcium: 8.6 mg/dL — ABNORMAL LOW (ref 8.9–10.3)
Chloride: 104 mmol/L (ref 98–111)
Creatinine, Ser: 1.15 mg/dL (ref 0.61–1.24)
GFR, Estimated: >60 mL/min
Glucose, Bld: 103 mg/dL — ABNORMAL HIGH (ref 70–99)
Potassium: 3.7 mmol/L (ref 3.5–5.1)
Sodium: 138 mmol/L (ref 135–145)
Total Bilirubin: 0.4 mg/dL (ref 0.0–1.2)
Total Protein: 6.1 g/dL — ABNORMAL LOW (ref 6.5–8.1)

## 2024-10-07 LAB — ETHANOL: Alcohol, Ethyl (B): <15 mg/dL

## 2024-10-07 MED ORDER — DIAZEPAM 5 MG/ML IJ SOLN
5.0000 mg | Freq: Once | INTRAMUSCULAR | Status: AC
  Administered 2024-10-07: 5 mg via INTRAVENOUS
  Filled 2024-10-07: qty 2

## 2024-10-07 NOTE — ED Triage Notes (Signed)
 Patient BIB [facility] from home due to muscle spasms. Patient states around midnight he took 2 5mg  THC gummies and went right to sleep. Woke up at 3am feeling as if his right eye rolled in the back of head accompanied by whole body spasms, states it comes in go. Patient states it the normal brand and dosage he would normally take, first time taking them in 4 months. A&Ox4 VSS.

## 2024-10-07 NOTE — Discharge Instructions (Signed)
 Avoid further THC. Follow up with your primary care provider. Return to ER for worsening or concerning symptoms. Follow up with neurology, call to schedule an appointment.
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
## 2024-10-12 ENCOUNTER — Encounter: Payer: Self-pay | Admitting: Family Medicine

## 2024-10-12 ENCOUNTER — Ambulatory Visit: Admitting: Family Medicine

## 2024-10-12 VITALS — BP 126/88 | HR 70 | Wt 229.2 lb

## 2024-10-12 DIAGNOSIS — G4733 Obstructive sleep apnea (adult) (pediatric): Secondary | ICD-10-CM | POA: Diagnosis not present

## 2024-10-12 DIAGNOSIS — F12922 Cannabis use, unspecified with intoxication with perceptual disturbance: Secondary | ICD-10-CM

## 2024-10-12 DIAGNOSIS — Z91199 Patient's noncompliance with other medical treatment and regimen due to unspecified reason: Secondary | ICD-10-CM

## 2024-10-12 DIAGNOSIS — R55 Syncope and collapse: Secondary | ICD-10-CM

## 2024-10-12 DIAGNOSIS — F5101 Primary insomnia: Secondary | ICD-10-CM | POA: Diagnosis not present

## 2024-10-12 MED ORDER — AMITRIPTYLINE HCL 10 MG PO TABS: 10.0000 mg | ORAL_TABLET | Freq: Every day | ORAL | 0 refills | Status: AC

## 2024-10-12 NOTE — Patient Instructions (Signed)
 Amitryliptine

## 2024-10-12 NOTE — Progress Notes (Signed)
 "  Name: Jesse Bentley   Date of Visit: 10/12/2024   Date of last visit with me: Visit date not found   CHIEF COMPLAINT:  Chief Complaint  Patient presents with   Follow-up    Hospital follow up- pt thinks they had a drug reaction to [facility]. Total of 10 mg taken the night of the reaction. Poor systemic motor control. Has taken THC before (and same dose) and did not experience this reaction before  Unrelated- pt states that when they stand up, BP rises, they think its orthostatic hypertension       HPI:  Discussed the use of AI scribe software for clinical note transcription with the patient, who gave verbal consent to proceed.  History of Present Illness   Jesse Bentley is a 48 year old male who presents with episodes of intense shaking and right-sided symptoms following THC use.  He experienced an episode of intense shaking and right-sided symptoms after taking 10 mg of THC for sleep. Upon waking, his right eye rolled back, and he experienced intense shaking. He was able to walk but would fall over when stopping. Initially, he thought he was having a stroke due to the symptoms being largely on his right side and involving musculoskeletal contractions. He sought help from a nearby fire station and was taken to the ER, where a blood panel and CT scan were conducted.  He recalls a similar episode in the summer where he passed out after swinging his head while sitting on the toilet. In September, during a routine 12-lead ECG, he was found to have incurred T waves with late transition. He has a history of a concussion in 2020, after which he experienced increased stress and dysfunctions, including worsened sleep apnea.  His sleep study showed moderate apnea and an SAO2 drop to 73%. He was prescribed a CPAP machine but faced issues with its fit and functionality, leading to non-compliance and subsequent collection actions. He reports that a vestibular therapy session provided symptom relief for three  months.  He experiences stress-related exacerbations of his symptoms twice a year, particularly during the academic semester when his workload increases. He has a history of thoracic outlet syndrome, which he managed with shoulder exercises, but continues to experience right-sided musculoskeletal pain and syncope. He practices vagal stimulation and reports improved sleep quality with these interventions.         OBJECTIVE:       01/22/2023    8:59 AM  Depression screen PHQ 2/9  Decreased Interest 0  Down, Depressed, Hopeless 0  PHQ - 2 Score 0     BP Readings from Last 3 Encounters:  10/12/24 126/88  10/07/24 (!) 132/96  01/22/23 120/68    BP 126/88   Pulse 70   Wt 229 lb 3.2 oz (104 kg)   SpO2 97%   BMI 30.24 kg/m    Physical Exam          Physical Exam Constitutional:      Appearance: Normal appearance.  Neurological:     General: No focal deficit present.     Mental Status: He is alert and oriented to person, place, and time. Mental status is at baseline.     ASSESSMENT/PLAN:   Assessment & Plan Cannabis intoxication with perceptual disturbance  Syncope, unspecified syncope type  OSA (obstructive sleep apnea)  Lack of follow-up after hospitalization  Primary insomnia    Assessment and Plan    Obstructive sleep apnea with CPAP intolerance Moderate obstructive sleep  apnea with CPAP intolerance due to mask fit and support issues. Symptoms include peripheral and central apnea with oxygen saturation dropping to 73%. Potential glottis issues causing mouth breathing and inadequate air intake. Consideration of Inspire surgery if CPAP remains intolerable and glottis issues are confirmed. - Try mouth taping at night to promote nasal breathing. - Consider referral to ENT for evaluation of glottis issues and potential Inspire surgery. - Discuss with Ludie about obtaining a new CPAP machine or alternative treatments.  Recurrent syncope and episodic neurological  symptoms Recurrent syncope and episodic neurological symptoms, possibly related to sleep apnea and stress. Differential includes drug reaction versus neurological event. Previous episodes resolved with vestibular therapy. - If syncope or neurological symptoms recur, consider Holter monitor to assess cardiac rhythm. - Discuss with Ludie about potential referral to neurology if symptoms persist or worsen.  Cannabis use with prior intoxication Cannabis use with prior intoxication leading to syncope and neurological symptoms. THC use may have contributed to recent episode, but correlation is uncertain. - Advised caution with THC use, especially in relation to sleep and stress management.  Thoracic outlet syndrome Previous syncope and musculoskeletal pain improved with shoulder release and strengthening exercises. Current symptoms may be related to stress and sleep issues rather than musculoskeletal causes. - Continue shoulder release and strengthening exercises as needed.      Total time spent on the date of the encounter was 42 minutes, which included reviewing the patients chart, performing a history and physical exam, ordering and reviewing studies, coordinating care, and counseling the patient regarding diagnosis and treatment options. The time spent was medically necessary and supports billing based on total time.    Elouise Divelbiss [facility] "
